# Patient Record
Sex: Female | Born: 1975 | Race: White | Hispanic: No | Marital: Married
Health system: Southern US, Community
[De-identification: ages and names within clinical notes are randomized; demographics above are authoritative.]

---

## 2005-02-10 ENCOUNTER — Ambulatory Visit: Payer: Self-pay | Admitting: Obstetrics and Gynecology

## 2007-01-02 ENCOUNTER — Ambulatory Visit: Payer: Self-pay

## 2013-06-18 ENCOUNTER — Ambulatory Visit: Payer: Self-pay

## 2018-05-15 ENCOUNTER — Emergency Department
Admission: EM | Admit: 2018-05-15 | Discharge: 2018-05-15 | Disposition: A | Discharge status: Home / Self Care | Payer: Self-pay | Attending: Emergency Medicine | Admitting: Emergency Medicine

## 2018-05-15 ENCOUNTER — Emergency Department: Payer: Self-pay

## 2018-05-15 ENCOUNTER — Encounter: Payer: Self-pay | Admitting: Emergency Medicine

## 2018-05-15 DIAGNOSIS — R109 Unspecified abdominal pain: Secondary | ICD-10-CM

## 2018-05-15 DIAGNOSIS — R102 Pelvic and perineal pain: Secondary | ICD-10-CM

## 2018-05-15 DIAGNOSIS — N83202 Unspecified ovarian cyst, left side: Secondary | ICD-10-CM | POA: Insufficient documentation

## 2018-05-15 LAB — COMPREHENSIVE METABOLIC PANEL
ALBUMIN: 4.5 g/dL (ref 3.5–5.0)
ALT: 13 U/L (ref 0–44)
AST: 17 U/L (ref 15–41)
Alkaline Phosphatase: 49 U/L (ref 38–126)
Anion gap: 7 (ref 5–15)
BUN: 10 mg/dL (ref 6–20)
CHLORIDE: 105 mmol/L (ref 98–111)
CO2: 25 mmol/L (ref 22–32)
Calcium: 9.4 mg/dL (ref 8.9–10.3)
Creatinine, Ser: 0.52 mg/dL (ref 0.44–1.00)
GFR calc Af Amer: >60 mL/min (ref >60)
GLUCOSE: 104 mg/dL — AB (ref 70–99)
Potassium: 4.3 mmol/L (ref 3.5–5.1)
SODIUM: 137 mmol/L (ref 135–145)
Total Bilirubin: 0.9 mg/dL (ref 0.3–1.2)
Total Protein: 7.6 g/dL (ref 6.5–8.1)

## 2018-05-15 LAB — CBC
HCT: 45.5 % (ref 36.0–46.0)
Hemoglobin: 15.3 g/dL — ABNORMAL HIGH (ref 12.0–15.0)
MCH: 31.5 pg (ref 26.0–34.0)
MCHC: 33.6 g/dL (ref 30.0–36.0)
MCV: 93.6 fL (ref 80.0–100.0)
Platelets: 188 10*3/uL (ref 150–400)
RBC: 4.86 MIL/uL (ref 3.87–5.11)
RDW: 13.6 % (ref 11.5–15.5)
WBC: 14.8 10*3/uL — ABNORMAL HIGH (ref 4.0–10.5)
nRBC: 0 % (ref 0.0–0.2)

## 2018-05-15 LAB — URINALYSIS, COMPLETE (UACMP) WITH MICROSCOPIC
Bacteria, UA: NONE SEEN
Bilirubin Urine: NEGATIVE
Glucose, UA: NEGATIVE mg/dL
Hgb urine dipstick: NEGATIVE
Ketones, ur: NEGATIVE mg/dL
Leukocytes,Ua: NEGATIVE
Nitrite: NEGATIVE
Protein, ur: NEGATIVE mg/dL
Specific Gravity, Urine: 1.009 (ref 1.005–1.030)
pH: 5 (ref 5.0–8.0)

## 2018-05-15 LAB — LIPASE, BLOOD: LIPASE: 39 U/L (ref 11–51)

## 2018-05-15 LAB — PREGNANCY, URINE: Preg Test, Ur: NEGATIVE

## 2018-05-15 MED ORDER — MORPHINE SULFATE (PF) 4 MG/ML IV SOLN
4.0000 mg | Freq: Once | INTRAVENOUS | Status: AC
  Administered 2018-05-15: 4 mg via INTRAVENOUS
  Filled 2018-05-15: qty 1

## 2018-05-15 MED ORDER — NAPROXEN 500 MG PO TABS: 500.0000 mg | ORAL_TABLET | Freq: Two times a day (BID) | ORAL | 2 refills | Status: AC

## 2018-05-15 MED ORDER — ONDANSETRON HCL 4 MG/2ML IJ SOLN
4.0000 mg | Freq: Once | INTRAMUSCULAR | Status: AC
  Administered 2018-05-15: 4 mg via INTRAVENOUS
  Filled 2018-05-15: qty 2

## 2018-05-15 MED ORDER — KETOROLAC TROMETHAMINE 30 MG/ML IJ SOLN
30.0000 mg | Freq: Once | INTRAMUSCULAR | Status: AC
  Administered 2018-05-15: 30 mg via INTRAVENOUS
  Filled 2018-05-15: qty 1

## 2018-05-15 MED ORDER — SODIUM CHLORIDE 0.9 % IV SOLN
1000.0000 mL | Freq: Once | INTRAVENOUS | Status: AC
  Administered 2018-05-15: 1000 mL via INTRAVENOUS

## 2018-05-15 NOTE — ED Notes (Signed)
First Nurse Note: Patient ambulatory to Rm 4, Jerrie RN aware of room placement.

## 2018-05-15 NOTE — ED Provider Notes (Signed)
[facility] Emergency Department Provider Note   ____________________________________________    I have reviewed the triage vital signs and the nursing notes.   HISTORY  Chief Complaint Abdominal Pain     HPI Yolanda Blake is a 43 y.o. female who presents with complaints of abdominal pain.  Patient describes left lower quadrant abdominal pain which has worsened over the last 2 days, it has been constant, she describes it as a severe aching type pain.  Mild nausea no vomiting, decreased p.o. intake.  Has not taken anything for this.  Denies fevers.  No recent travel.  No diarrhea.  No history of diverticulitis.  History of a C-section  History reviewed. No pertinent past medical history.  There are no active problems to display for this patient.   History reviewed. No pertinent surgical history.  Prior to Admission medications   Medication Sig Start Date End Date Taking? Authorizing Provider  naproxen (NAPROSYN) 500 MG tablet Take 1 tablet (500 mg total) by mouth 2 (two) times daily with a meal. 05/15/18   Jene Every, MD     Allergies Patient has no known allergies.  No family history on file.  Social History Social History   Tobacco Use  . Smoking status: Not on file  Substance Use Topics  . Alcohol use: Not on file  . Drug use: Not on file    Review of Systems  Constitutional: No fever/chills Eyes: No visual changes.  ENT: No sore throat. Cardiovascular: Denies chest pain. Respiratory: Denies shortness of breath. Gastrointestinal: As above Genitourinary: Negative for dysuria. Musculoskeletal: Negative for back pain. Skin: Negative for rash. Neurological: Negative for headaches   ____________________________________________   PHYSICAL EXAM:  VITAL SIGNS: ED Triage Vitals  Enc Vitals Group     BP 05/15/18 0847 (!) 154/79     Pulse Rate 05/15/18 0847 71     Resp 05/15/18 0847 20     Temp 05/15/18 0847 98.5 F (36.9  C)     Temp Source 05/15/18 0847 Oral     SpO2 05/15/18 0847 100 %     Weight 05/15/18 0848 59 kg (130 lb)     Height 05/15/18 0848 1.626 m (5\' 4" )     Head Circumference --      Peak Flow --      Pain Score 05/15/18 0848 7     Pain Loc --      Pain Edu? --      Excl. in GC? --     Constitutional: Alert and oriented. No acute distress. Pleasant and interactive Eyes: Conjunctivae are normal.  Head: Atraumatic. Nose: No congestion/rhinnorhea.  Cardiovascular: Normal rate, regular rhythm. Grossly normal heart sounds.  Good peripheral circulation. Respiratory: Normal respiratory effort.  No retractions. Lungs CTAB. Gastrointestinal: Tenderness palpation left lower quadrant. No distention.  No CVA tenderness.  Musculoskeletal: .  Warm and well perfused Neurologic:  Normal speech and language. No gross focal neurologic deficits are appreciated.  Skin:  Skin is warm, dry and intact. No rash noted. Psychiatric: Mood and affect are normal. Speech and behavior are normal.  ____________________________________________   LABS (all labs ordered are listed, but only abnormal results are displayed)  Labs Reviewed  COMPREHENSIVE METABOLIC PANEL - Abnormal; Notable for the following components:      Result Value   Glucose, Bld 104 (*)    All other components within normal limits  CBC - Abnormal; Notable for the following components:   WBC 14.8 (*)  Hemoglobin 15.3 (*)    All other components within normal limits  URINALYSIS, COMPLETE (UACMP) WITH MICROSCOPIC - Abnormal; Notable for the following components:   Color, Urine YELLOW (*)    APPearance CLEAR (*)    All other components within normal limits  LIPASE, BLOOD  PREGNANCY, URINE   ____________________________________________  EKG  ED ECG REPORT I, Jene Every, the attending physician, personally viewed and interpreted this ECG.  Date: 05/15/2018  Rhythm: Sinus bradycardia QRS Axis: normal Intervals: normal ST/T  Wave abnormalities: normal Narrative Interpretation: no evidence of acute ischemia  ____________________________________________  RADIOLOGY  CT scan negative for diverticulitis, heterogenous ovarian cyst will obtain ultrasound ____________________________________________   PROCEDURES  Procedure(s) performed: No  Procedures   Critical Care performed: No ____________________________________________   INITIAL IMPRESSION / ASSESSMENT AND PLAN / ED COURSE  Pertinent labs & imaging results that were available during my care of the patient were reviewed by me and considered in my medical decision making (see chart for details).  Patient presents with left lower quadrant abdominal pain, elevated white blood cell count, suspicious for diverticulitis, urine is unremarkable.  Will obtain CT and treat with morphine and Zofran IV  CT scan negative for diverticulitis, positive ovarian cyst, pending ultrasound, will give IV Toradol as she still has some pain  Ultrasound demonstrates left ovarian cyst, discussed with her the need for repeat ultrasound in 6 weeks.  I have asked her to follow-up closely with gynecology for further evaluation    ____________________________________________   FINAL CLINICAL IMPRESSION(S) / ED DIAGNOSES  Final diagnoses:  Abdominal pain  Cyst of left ovary        Note:  This document was prepared using Dragon voice recognition software and may include unintentional dictation errors.   Jene Every, MD 05/15/18 1436

## 2018-05-15 NOTE — ED Triage Notes (Signed)
Pt reports LLQ pain since Friday. Pt reports pain is constant. Pt states that she was constipated but she took a colace. Pt last BM was this am and not normal per her. Pt reports has been nauseated and had sweats.

## 2020-08-06 IMAGING — CT CT ABD-PELV W/O CM
2 of 4 series · 16 of 46 positions shown, 18 images · non-contrast
Comparison: None.

CLINICAL DATA: Abdominal pain with diverticulitis suspected

EXAM:
CT ABDOMEN AND PELVIS WITHOUT CONTRAST
TECHNIQUE: Multidetector CT imaging of the abdomen and pelvis was performed
following the standard protocol without IV contrast.

[Series 5: coronal st · coronal · 0.67mm/px · 3 of 76 slices shown]
[im 26/76  soft-tissue]
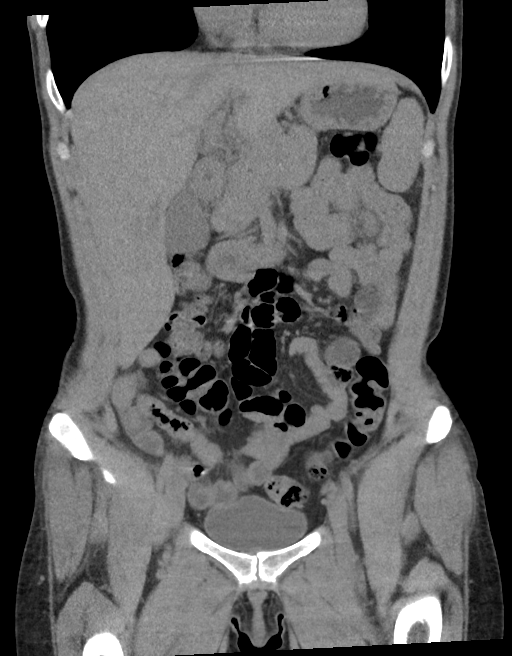
[im 34/76  soft-tissue]
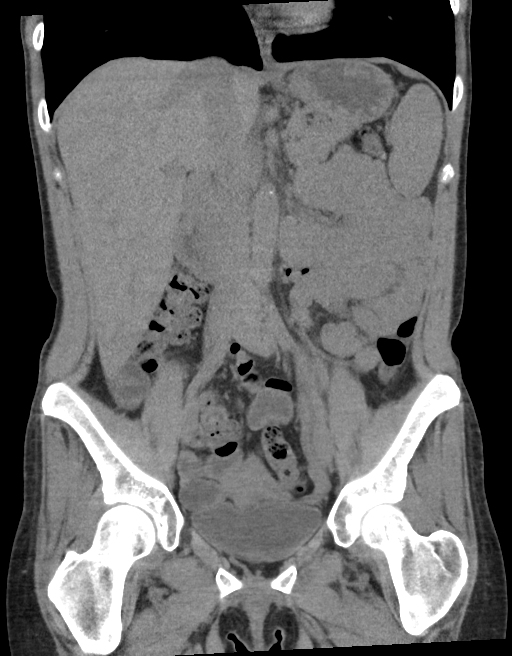
[im 42/76  soft-tissue]
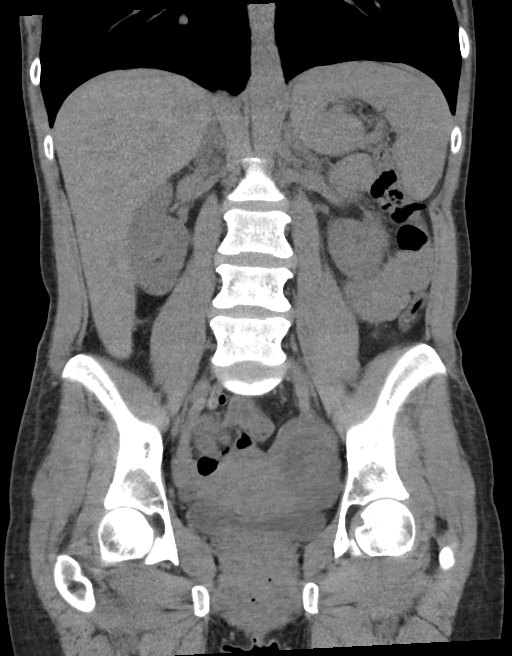

[Series 7: routine abd/pel wo 2 · axial · 0.72mm/px · z∈[-365,+30]mm · 13 of 87 slices shown, 15 images]
[im 4/87  soft-tissue]
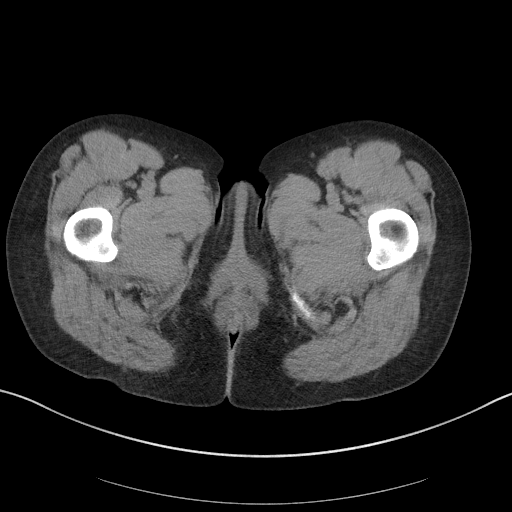
[im 4/87  bone]
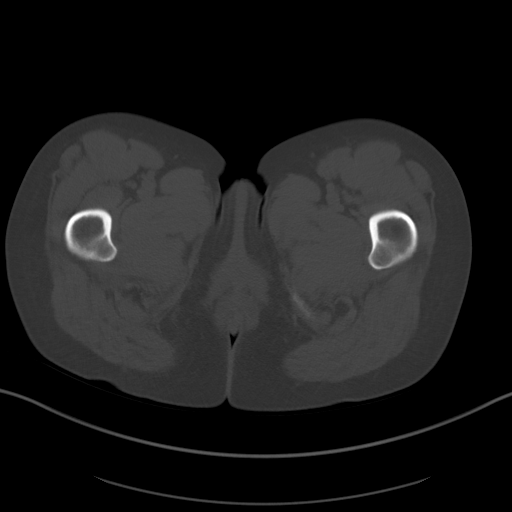
[im 11/87  soft-tissue]
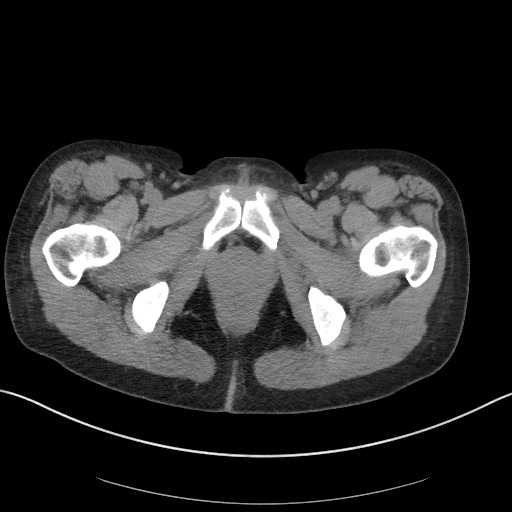
[im 18/87  soft-tissue]
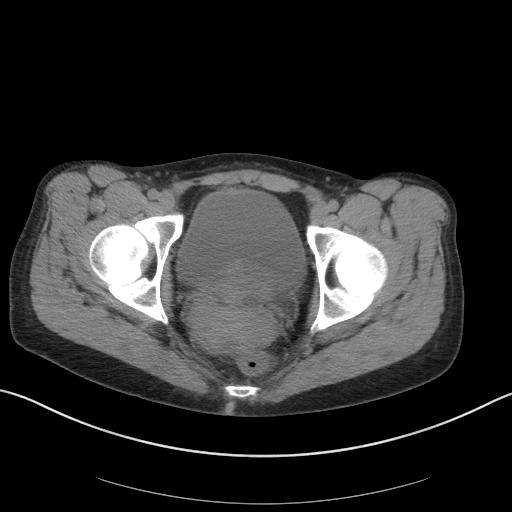
[im 25/87  soft-tissue]
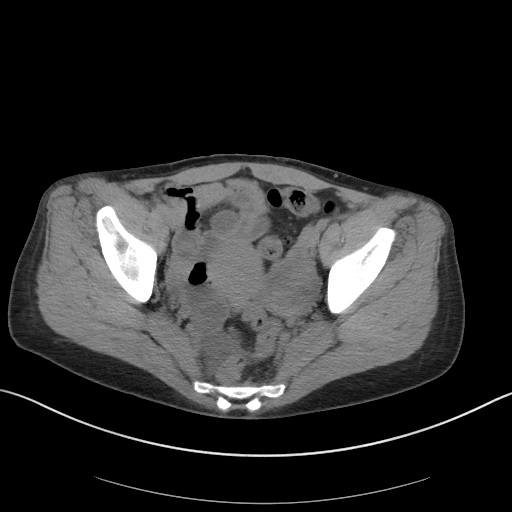
[im 31/87  soft-tissue]
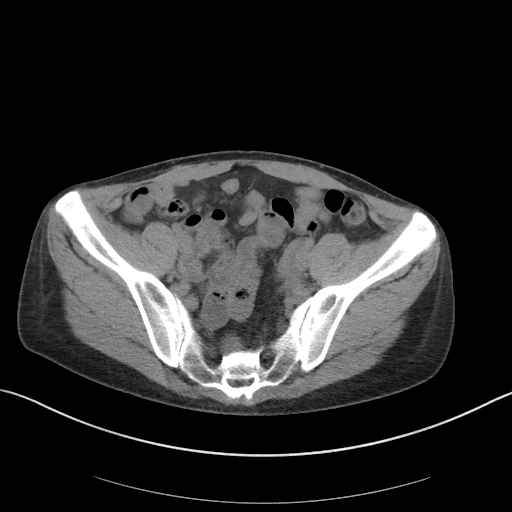
[im 38/87  soft-tissue]
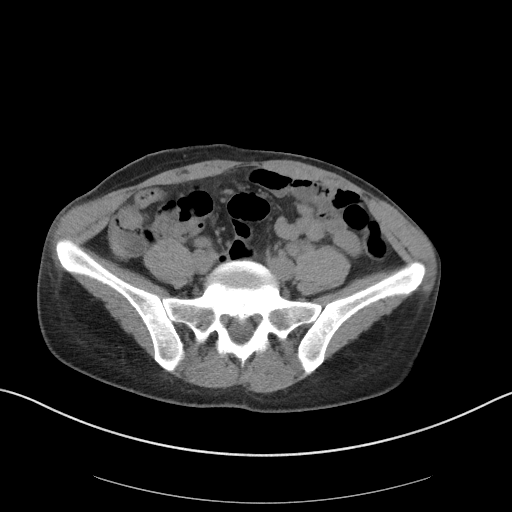
[im 45/87  soft-tissue]
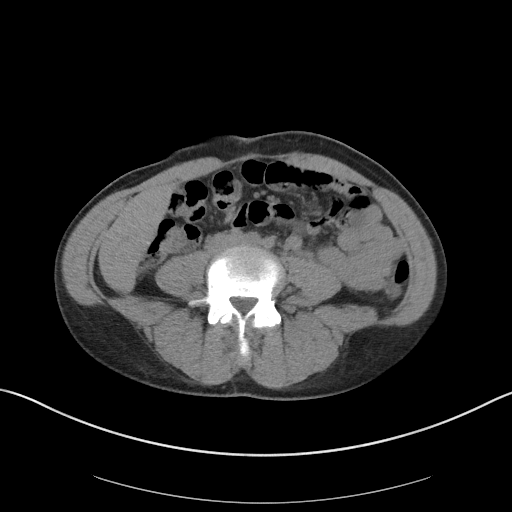
[im 49/87  soft-tissue]
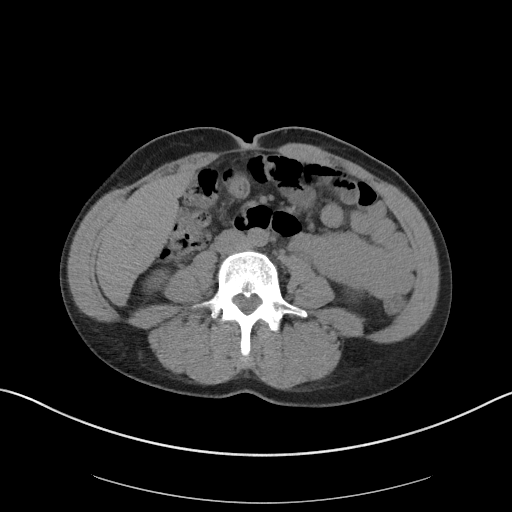
[im 56/87  soft-tissue]
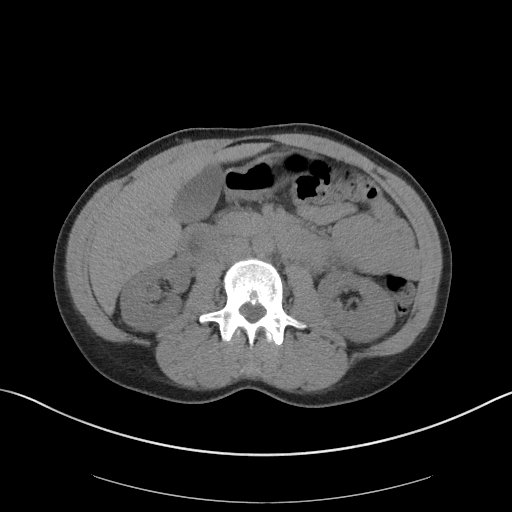
[im 56/87  bone]
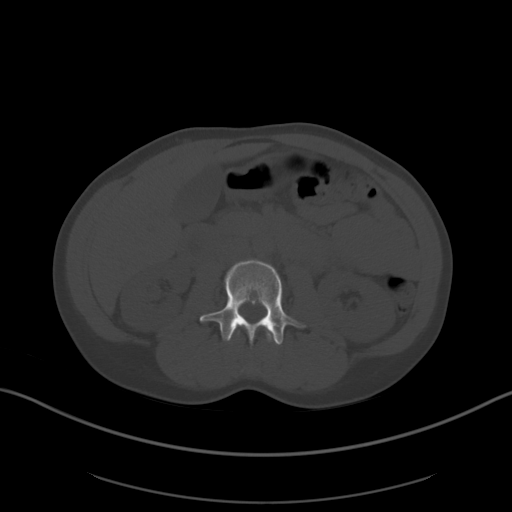
[im 62/87  soft-tissue]
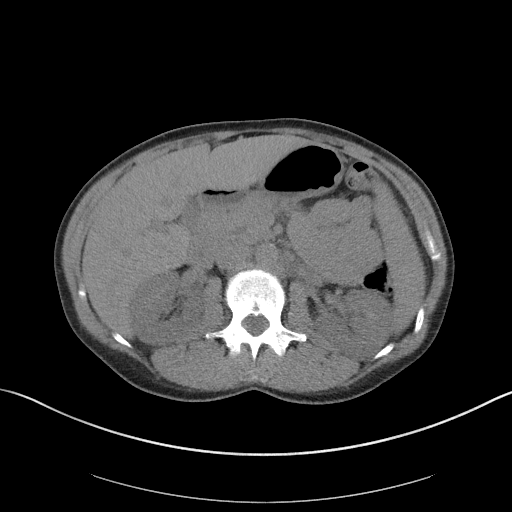
[im 69/87  soft-tissue]
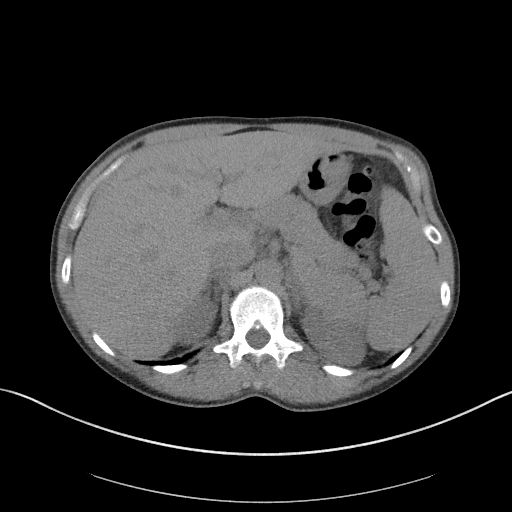
[im 76/87  soft-tissue]
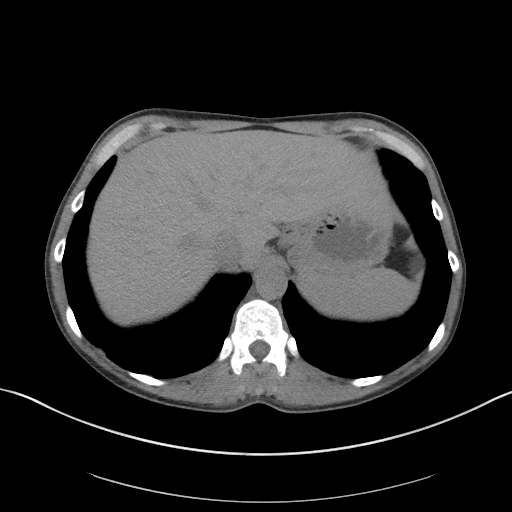
[im 83/87  soft-tissue]
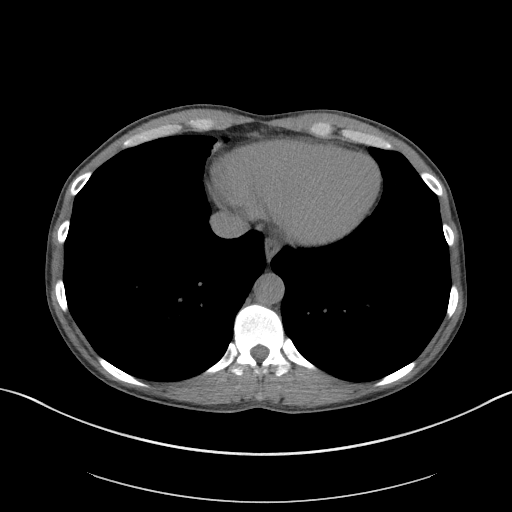

[16 of 46 positions shown; findings below may reference images not displayed]

FINDINGS: Lower chest:  Mild atelectasis at the bases.

Hepatobiliary: No focal liver abnormality.No evidence of biliary
obstruction or stone.

Pancreas: Unremarkable.

Spleen: Unremarkable.

Adrenals/Urinary Tract: Negative adrenals. No hydronephrosis or
stone. Unremarkable bladder.

Stomach/Bowel: No obstruction. No noted diverticula. On a few slices
there is questionable fat stranding about the duodenal jejunal
junction, but not convincing on reformats and no evident underlying
bowel thickening or submucosal edema. Given the ovarian finding,
question if this is stranding around the gonadal vessels.

Vascular/Lymphatic: No acute vascular abnormality. No mass or
adenopathy.

Reproductive:Heterogeneous enlargement of the left ovary with high
density components possibly related to hemorrhage. The ovary
measures up to 5.7 cm craniocaudal.

Other: Trace pelvic fluid which could be reactive or physiologic

Musculoskeletal: No acute abnormalities.
IMPRESSION: 1. Heterogeneous enlargement of the left ovary which may be the
symptomatic finding. Suggest pelvic ultrasound with Doppler.
2. No diverticulitis or diverticulosis.
